# Patient Record
Sex: Female | Born: 2000 | Race: Black or African American | Hispanic: No | Marital: Single | Smoking: Never smoker
Health system: Southern US, Community
[De-identification: ages and names within clinical notes are randomized; demographics above are authoritative.]

---
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
## 2022-01-08 DIAGNOSIS — F321 Major depressive disorder, single episode, moderate: Secondary | ICD-10-CM | POA: Diagnosis not present

## 2022-01-20 DIAGNOSIS — F321 Major depressive disorder, single episode, moderate: Secondary | ICD-10-CM | POA: Diagnosis not present

## 2022-02-03 DIAGNOSIS — F321 Major depressive disorder, single episode, moderate: Secondary | ICD-10-CM | POA: Diagnosis not present

## 2022-02-17 DIAGNOSIS — F321 Major depressive disorder, single episode, moderate: Secondary | ICD-10-CM | POA: Diagnosis not present

## 2022-02-24 DIAGNOSIS — N760 Acute vaginitis: Secondary | ICD-10-CM | POA: Diagnosis not present

## 2022-02-24 DIAGNOSIS — N898 Other specified noninflammatory disorders of vagina: Secondary | ICD-10-CM | POA: Diagnosis not present

## 2022-02-24 DIAGNOSIS — R300 Dysuria: Secondary | ICD-10-CM | POA: Diagnosis not present

## 2022-03-03 DIAGNOSIS — F411 Generalized anxiety disorder: Secondary | ICD-10-CM | POA: Diagnosis not present

## 2022-03-03 DIAGNOSIS — F321 Major depressive disorder, single episode, moderate: Secondary | ICD-10-CM | POA: Diagnosis not present

## 2022-03-05 DIAGNOSIS — F321 Major depressive disorder, single episode, moderate: Secondary | ICD-10-CM | POA: Diagnosis not present

## 2022-03-05 DIAGNOSIS — F411 Generalized anxiety disorder: Secondary | ICD-10-CM | POA: Diagnosis not present

## 2022-03-17 DIAGNOSIS — F411 Generalized anxiety disorder: Secondary | ICD-10-CM | POA: Diagnosis not present

## 2022-04-01 DIAGNOSIS — F411 Generalized anxiety disorder: Secondary | ICD-10-CM | POA: Diagnosis not present

## 2022-04-14 DIAGNOSIS — F411 Generalized anxiety disorder: Secondary | ICD-10-CM | POA: Diagnosis not present

## 2022-04-15 DIAGNOSIS — J3089 Other allergic rhinitis: Secondary | ICD-10-CM | POA: Diagnosis not present

## 2022-04-15 DIAGNOSIS — J3081 Allergic rhinitis due to animal (cat) (dog) hair and dander: Secondary | ICD-10-CM | POA: Diagnosis not present

## 2022-04-15 DIAGNOSIS — J301 Allergic rhinitis due to pollen: Secondary | ICD-10-CM | POA: Diagnosis not present

## 2022-04-15 DIAGNOSIS — L2089 Other atopic dermatitis: Secondary | ICD-10-CM | POA: Diagnosis not present

## 2022-04-28 DIAGNOSIS — F411 Generalized anxiety disorder: Secondary | ICD-10-CM | POA: Diagnosis not present

## 2022-05-11 DIAGNOSIS — F411 Generalized anxiety disorder: Secondary | ICD-10-CM | POA: Diagnosis not present

## 2022-06-10 DIAGNOSIS — F411 Generalized anxiety disorder: Secondary | ICD-10-CM | POA: Diagnosis not present

## 2022-06-16 DIAGNOSIS — F411 Generalized anxiety disorder: Secondary | ICD-10-CM | POA: Diagnosis not present

## 2022-06-23 DIAGNOSIS — F411 Generalized anxiety disorder: Secondary | ICD-10-CM | POA: Diagnosis not present

## 2022-06-29 DIAGNOSIS — F411 Generalized anxiety disorder: Secondary | ICD-10-CM | POA: Diagnosis not present

## 2022-07-06 DIAGNOSIS — F411 Generalized anxiety disorder: Secondary | ICD-10-CM | POA: Diagnosis not present

## 2022-07-13 DIAGNOSIS — F411 Generalized anxiety disorder: Secondary | ICD-10-CM | POA: Diagnosis not present

## 2022-07-21 DIAGNOSIS — F411 Generalized anxiety disorder: Secondary | ICD-10-CM | POA: Diagnosis not present

## 2022-07-28 DIAGNOSIS — F411 Generalized anxiety disorder: Secondary | ICD-10-CM | POA: Diagnosis not present

## 2022-08-04 DIAGNOSIS — F411 Generalized anxiety disorder: Secondary | ICD-10-CM | POA: Diagnosis not present

## 2022-08-11 DIAGNOSIS — F411 Generalized anxiety disorder: Secondary | ICD-10-CM | POA: Diagnosis not present

## 2022-08-18 DIAGNOSIS — F411 Generalized anxiety disorder: Secondary | ICD-10-CM | POA: Diagnosis not present

## 2022-08-25 DIAGNOSIS — F411 Generalized anxiety disorder: Secondary | ICD-10-CM | POA: Diagnosis not present

## 2022-09-01 DIAGNOSIS — F411 Generalized anxiety disorder: Secondary | ICD-10-CM | POA: Diagnosis not present

## 2022-09-08 DIAGNOSIS — F411 Generalized anxiety disorder: Secondary | ICD-10-CM | POA: Diagnosis not present

## 2022-09-15 DIAGNOSIS — F411 Generalized anxiety disorder: Secondary | ICD-10-CM | POA: Diagnosis not present

## 2022-09-22 DIAGNOSIS — F411 Generalized anxiety disorder: Secondary | ICD-10-CM | POA: Diagnosis not present

## 2022-09-29 DIAGNOSIS — F411 Generalized anxiety disorder: Secondary | ICD-10-CM | POA: Diagnosis not present

## 2022-10-06 DIAGNOSIS — F411 Generalized anxiety disorder: Secondary | ICD-10-CM | POA: Diagnosis not present

## 2022-10-13 DIAGNOSIS — F411 Generalized anxiety disorder: Secondary | ICD-10-CM | POA: Diagnosis not present

## 2022-10-20 DIAGNOSIS — F411 Generalized anxiety disorder: Secondary | ICD-10-CM | POA: Diagnosis not present

## 2022-10-27 DIAGNOSIS — F411 Generalized anxiety disorder: Secondary | ICD-10-CM | POA: Diagnosis not present

## 2022-10-31 DIAGNOSIS — T7840XA Allergy, unspecified, initial encounter: Secondary | ICD-10-CM | POA: Diagnosis not present

## 2022-10-31 DIAGNOSIS — T781XXA Other adverse food reactions, not elsewhere classified, initial encounter: Secondary | ICD-10-CM | POA: Diagnosis not present

## 2022-11-03 DIAGNOSIS — F411 Generalized anxiety disorder: Secondary | ICD-10-CM | POA: Diagnosis not present

## 2022-11-17 DIAGNOSIS — F411 Generalized anxiety disorder: Secondary | ICD-10-CM | POA: Diagnosis not present

## 2022-11-24 DIAGNOSIS — F411 Generalized anxiety disorder: Secondary | ICD-10-CM | POA: Diagnosis not present

## 2022-12-08 DIAGNOSIS — F411 Generalized anxiety disorder: Secondary | ICD-10-CM | POA: Diagnosis not present

## 2022-12-22 DIAGNOSIS — F411 Generalized anxiety disorder: Secondary | ICD-10-CM | POA: Diagnosis not present

## 2022-12-29 DIAGNOSIS — F411 Generalized anxiety disorder: Secondary | ICD-10-CM | POA: Diagnosis not present

## 2023-01-05 DIAGNOSIS — F411 Generalized anxiety disorder: Secondary | ICD-10-CM | POA: Diagnosis not present

## 2023-01-12 DIAGNOSIS — F411 Generalized anxiety disorder: Secondary | ICD-10-CM | POA: Diagnosis not present

## 2023-01-19 DIAGNOSIS — F411 Generalized anxiety disorder: Secondary | ICD-10-CM | POA: Diagnosis not present

## 2023-01-26 DIAGNOSIS — F411 Generalized anxiety disorder: Secondary | ICD-10-CM | POA: Diagnosis not present

## 2023-02-02 DIAGNOSIS — F411 Generalized anxiety disorder: Secondary | ICD-10-CM | POA: Diagnosis not present

## 2023-02-09 DIAGNOSIS — F411 Generalized anxiety disorder: Secondary | ICD-10-CM | POA: Diagnosis not present

## 2023-02-16 DIAGNOSIS — F411 Generalized anxiety disorder: Secondary | ICD-10-CM | POA: Diagnosis not present

## 2023-02-23 DIAGNOSIS — F411 Generalized anxiety disorder: Secondary | ICD-10-CM | POA: Diagnosis not present

## 2023-03-02 DIAGNOSIS — F411 Generalized anxiety disorder: Secondary | ICD-10-CM | POA: Diagnosis not present

## 2023-03-09 DIAGNOSIS — F411 Generalized anxiety disorder: Secondary | ICD-10-CM | POA: Diagnosis not present

## 2023-03-16 DIAGNOSIS — F411 Generalized anxiety disorder: Secondary | ICD-10-CM | POA: Diagnosis not present

## 2023-03-23 DIAGNOSIS — F411 Generalized anxiety disorder: Secondary | ICD-10-CM | POA: Diagnosis not present

## 2023-03-30 DIAGNOSIS — F411 Generalized anxiety disorder: Secondary | ICD-10-CM | POA: Diagnosis not present

## 2023-04-06 DIAGNOSIS — F411 Generalized anxiety disorder: Secondary | ICD-10-CM | POA: Diagnosis not present

## 2023-04-13 DIAGNOSIS — F411 Generalized anxiety disorder: Secondary | ICD-10-CM | POA: Diagnosis not present

## 2023-04-20 DIAGNOSIS — F411 Generalized anxiety disorder: Secondary | ICD-10-CM | POA: Diagnosis not present

## 2023-04-21 DIAGNOSIS — L2089 Other atopic dermatitis: Secondary | ICD-10-CM | POA: Diagnosis not present

## 2023-04-21 DIAGNOSIS — J301 Allergic rhinitis due to pollen: Secondary | ICD-10-CM | POA: Diagnosis not present

## 2023-04-21 DIAGNOSIS — J3089 Other allergic rhinitis: Secondary | ICD-10-CM | POA: Diagnosis not present

## 2023-04-21 DIAGNOSIS — J3081 Allergic rhinitis due to animal (cat) (dog) hair and dander: Secondary | ICD-10-CM | POA: Diagnosis not present

## 2023-04-27 DIAGNOSIS — F411 Generalized anxiety disorder: Secondary | ICD-10-CM | POA: Diagnosis not present

## 2023-05-04 DIAGNOSIS — F411 Generalized anxiety disorder: Secondary | ICD-10-CM | POA: Diagnosis not present

## 2023-05-11 DIAGNOSIS — F411 Generalized anxiety disorder: Secondary | ICD-10-CM | POA: Diagnosis not present

## 2023-05-25 DIAGNOSIS — F411 Generalized anxiety disorder: Secondary | ICD-10-CM | POA: Diagnosis not present

## 2023-05-30 DIAGNOSIS — F411 Generalized anxiety disorder: Secondary | ICD-10-CM | POA: Diagnosis not present

## 2024-04-05 DIAGNOSIS — J3089 Other allergic rhinitis: Secondary | ICD-10-CM | POA: Diagnosis not present

## 2024-04-05 DIAGNOSIS — L2089 Other atopic dermatitis: Secondary | ICD-10-CM | POA: Diagnosis not present

## 2024-04-05 DIAGNOSIS — J301 Allergic rhinitis due to pollen: Secondary | ICD-10-CM | POA: Diagnosis not present

## 2024-04-05 DIAGNOSIS — J3081 Allergic rhinitis due to animal (cat) (dog) hair and dander: Secondary | ICD-10-CM | POA: Diagnosis not present

## 2024-09-23 ENCOUNTER — Encounter (HOSPITAL_COMMUNITY): Payer: Self-pay

## 2024-09-23 ENCOUNTER — Emergency Department (HOSPITAL_COMMUNITY)
Admission: EM | Admit: 2024-09-23 | Discharge: 2024-09-23 | Disposition: A | Discharge status: Home / Self Care | Attending: Emergency Medicine | Admitting: Emergency Medicine

## 2024-09-23 ENCOUNTER — Other Ambulatory Visit: Payer: Self-pay

## 2024-09-23 DIAGNOSIS — T7840XA Allergy, unspecified, initial encounter: Secondary | ICD-10-CM | POA: Diagnosis not present

## 2024-09-23 MED ORDER — METHYLPREDNISOLONE SODIUM SUCC 125 MG IJ SOLR
125.0000 mg | Freq: Once | INTRAMUSCULAR | Status: AC
  Administered 2024-09-23: 125 mg via INTRAVENOUS
  Filled 2024-09-23: qty 2

## 2024-09-23 MED ORDER — FAMOTIDINE IN NACL 20-0.9 MG/50ML-% IV SOLN
20.0000 mg | Freq: Once | INTRAVENOUS | Status: AC
  Administered 2024-09-23: 20 mg via INTRAVENOUS
  Filled 2024-09-23: qty 50

## 2024-09-23 MED ORDER — ONDANSETRON HCL 4 MG/2ML IJ SOLN
4.0000 mg | Freq: Once | INTRAMUSCULAR | Status: AC
  Administered 2024-09-23: 4 mg via INTRAVENOUS
  Filled 2024-09-23: qty 2

## 2024-09-23 NOTE — Discharge Instructions (Signed)
 You were seen today for what was an apparent allergic reaction to possible walnuts.  If you develop signs of anaphylaxis it is recommended that you use your EpiPen  at home.  Follow-up with your primary care provider for further management of allergies in general.  If you develop shortness of breath or other emergent symptoms return immediately to the emergency department.

## 2024-09-23 NOTE — ED Provider Notes (Signed)
 [place] EMERGENCY DEPARTMENT AT [facility] Provider Note   CSN: [id number] Arrival date & time: 09/23/24  0345     Patient presents with: Allergic Reaction   Heather Romero is a 23 y.o. female.  Patient with past medical history significant for tree nut allergy presents to the emergency department after she believes she ate a walnut at around 9 PM.  She has some mild nausea.  She denies difficulty swallowing, shortness of breath, abdominal pain, chest pain.  She did vomit 1 time.  The patient took a Benadryl  shortly after 9 PM.    Allergic Reaction      Prior to Admission medications   Medication Sig Start Date End Date Taking? Authorizing Provider  EPINEPHrine  (EPIPEN ) 0.3 mg/0.3 mL IJ SOAJ injection Inject 0.3 mg into the muscle as needed. 12/11/20   Adah Corning A, FNP  loratadine (CLARITIN) 10 MG tablet Take 10 mg by mouth daily.    [provider]  Multiple Vitamin (MULTIVITAMIN WITH MINERALS) TABS tablet Take 1 tablet by mouth daily.    [provider]    Allergies: Peanut-containing drug products    Review of Systems  Updated Vital Signs BP 100/64   Pulse 66   Temp 98.3 F (36.8 C) (Oral)   Resp 16   Ht 5' 2 (1.575 m)   Wt 52.2 kg   LMP 09/16/2024 (Approximate)   SpO2 100%   BMI 21.03 kg/m   Physical Exam Vitals and nursing note reviewed.  Constitutional:      General: She is not in acute distress.    Appearance: She is well-developed.  HENT:     Head: Normocephalic and atraumatic.  Eyes:     Conjunctiva/sclera: Conjunctivae normal.  Cardiovascular:     Rate and Rhythm: Normal rate and regular rhythm.  Pulmonary:     Effort: Pulmonary effort is normal. No respiratory distress.     Breath sounds: Normal breath sounds. No stridor. No wheezing or rhonchi.  Abdominal:     Palpations: Abdomen is soft.     Tenderness: There is no abdominal tenderness.  Musculoskeletal:        General: No swelling.     Cervical back: Neck  supple.  Skin:    General: Skin is warm and dry.     Capillary Refill: Capillary refill takes less than 2 seconds.  Neurological:     Mental Status: She is alert.  Psychiatric:        Mood and Affect: Mood normal.     (all labs ordered are listed, but only abnormal results are displayed) Labs Reviewed - No data to display  EKG: None  Radiology: No results found.   Procedures   Medications Ordered in the ED  methylPREDNISolone sodium succinate (SOLU-MEDROL) 125 mg/2 mL injection 125 mg (125 mg Intravenous Given 09/23/24 0418)  famotidine  (PEPCID ) IVPB 20 mg premix (0 mg Intravenous Stopped 09/23/24 0455)  ondansetron (ZOFRAN) injection 4 mg (4 mg Intravenous Given 09/23/24 0519)                                    Medical Decision Making Risk Prescription drug management.   This patient presents to the ED for concern of allergic reaction, this involves an extensive number of treatment options, and is a complaint that carries with it a high risk of complications and morbidity.  The differential diagnosis includes allergic reaction, anaphylactic shock, others  Co morbidities / Chronic conditions that complicate the patient evaluation  Tree nut allergy   Additional history obtained:  Additional history obtained from EMR    Problem List / ED Course / Critical interventions / Medication management   I ordered medication including Solu-Medrol, Pepcid , Zofran Reevaluation of the patient after these medicines showed that the patient improved I have reviewed the patients home medicines and have made adjustments as needed   Test / Admission - Considered:  Patient does not appear to be anaphylactic shock.  She has reassuring vitals.  Her lung sounds have remained clear.  She did have some mild GI upset with some nausea which improved after Zofran.  She is tolerating oral intake.  I do not feel the patient needs epinephrine  at this time.  Patient appears stable for  discharge home with return precautions.  She has an EpiPen  prescription at home.      Final diagnoses:  Allergic reaction, initial encounter    ED Discharge Orders     None          Logan Ubaldo KATHEE DEVONNA 09/23/24 [id number]    Midge Golas, MD 09/23/24 [phone]

## 2024-09-23 NOTE — ED Triage Notes (Signed)
 Pt states she is allergic to tree nuts and ate a walnut a few hours ago. Pt states she has been nauseated and felt a lump in her throat. Pt took a benadryl  around 2100. Pt has vomited.
